# Patient Record
Sex: Male | Born: 1985 | Race: White | Hispanic: No | Marital: Single | Smoking: Never smoker
Health system: Southern US, Community
[De-identification: ages and names within clinical notes are randomized; demographics above are authoritative.]

## PROBLEM LIST (undated history)

## (undated) DIAGNOSIS — K3580 Unspecified acute appendicitis: Secondary | ICD-10-CM

## (undated) DIAGNOSIS — E559 Vitamin D deficiency, unspecified: Secondary | ICD-10-CM

## (undated) DIAGNOSIS — E538 Deficiency of other specified B group vitamins: Secondary | ICD-10-CM

## (undated) DIAGNOSIS — Z7189 Other specified counseling: Secondary | ICD-10-CM

## (undated) HISTORY — DX: Other specified counseling: Z71.89

## (undated) HISTORY — DX: Vitamin D deficiency, unspecified: E55.9

## (undated) HISTORY — DX: Unspecified acute appendicitis: K35.80

## (undated) HISTORY — DX: Deficiency of other specified B group vitamins: E53.8

---

## 2013-10-08 ENCOUNTER — Ambulatory Visit: Payer: Self-pay | Admitting: Internal Medicine

## 2016-02-19 DIAGNOSIS — Z7185 Encounter for immunization safety counseling: Secondary | ICD-10-CM

## 2016-02-19 DIAGNOSIS — Z7189 Other specified counseling: Secondary | ICD-10-CM | POA: Insufficient documentation

## 2016-02-19 HISTORY — DX: Encounter for immunization safety counseling: Z71.85

## 2016-02-20 DIAGNOSIS — E559 Vitamin D deficiency, unspecified: Secondary | ICD-10-CM | POA: Insufficient documentation

## 2016-02-20 DIAGNOSIS — E538 Deficiency of other specified B group vitamins: Secondary | ICD-10-CM

## 2016-02-20 HISTORY — DX: Vitamin D deficiency, unspecified: E55.9

## 2016-02-20 HISTORY — DX: Deficiency of other specified B group vitamins: E53.8

## 2016-07-15 ENCOUNTER — Observation Stay
Admission: AD | Admit: 2016-07-15 | Discharge: 2016-07-16 | Disposition: A | Discharge status: Home / Self Care | Payer: No Typology Code available for payment source | Source: Ambulatory Visit | Attending: Surgery | Admitting: Surgery

## 2016-07-15 ENCOUNTER — Observation Stay: Payer: No Typology Code available for payment source | Admitting: Anesthesiology

## 2016-07-15 ENCOUNTER — Other Ambulatory Visit: Payer: Self-pay | Admitting: Family Medicine

## 2016-07-15 ENCOUNTER — Encounter
Admission: AD | Disposition: A | Discharge status: Home / Self Care | Payer: Self-pay | Source: Ambulatory Visit | Attending: Surgery

## 2016-07-15 ENCOUNTER — Ambulatory Visit
Admission: RE | Admit: 2016-07-15 | Discharge: 2016-07-15 | Disposition: A | Discharge status: Home / Self Care | Payer: No Typology Code available for payment source | Source: Ambulatory Visit | Attending: Family Medicine | Admitting: Family Medicine

## 2016-07-15 DIAGNOSIS — R1031 Right lower quadrant pain: Secondary | ICD-10-CM

## 2016-07-15 DIAGNOSIS — K3580 Unspecified acute appendicitis: Secondary | ICD-10-CM

## 2016-07-15 DIAGNOSIS — K353 Acute appendicitis with localized peritonitis: Principal | ICD-10-CM | POA: Insufficient documentation

## 2016-07-15 HISTORY — DX: Unspecified acute appendicitis: K35.80

## 2016-07-15 HISTORY — PX: LAPAROSCOPIC APPENDECTOMY: SHX408

## 2016-07-15 LAB — BASIC METABOLIC PANEL
Anion gap: 10 (ref 5–15)
BUN: 21 mg/dL — ABNORMAL HIGH (ref 6–20)
CALCIUM: 9.1 mg/dL (ref 8.9–10.3)
CO2: 27 mmol/L (ref 22–32)
Chloride: 100 mmol/L — ABNORMAL LOW (ref 101–111)
Creatinine, Ser: 0.88 mg/dL (ref 0.61–1.24)
GFR calc non Af Amer: >60 mL/min (ref >60)
GLUCOSE: 81 mg/dL (ref 65–99)
Potassium: 3.8 mmol/L (ref 3.5–5.1)
Sodium: 137 mmol/L (ref 135–145)

## 2016-07-15 LAB — CBC
HEMATOCRIT: 42.6 % (ref 40.0–52.0)
Hemoglobin: 14.5 g/dL (ref 13.0–18.0)
MCH: 27.7 pg (ref 26.0–34.0)
MCHC: 34 g/dL (ref 32.0–36.0)
MCV: 81.3 fL (ref 80.0–100.0)
Platelets: 190 10*3/uL (ref 150–440)
RBC: 5.23 MIL/uL (ref 4.40–5.90)
RDW: 13.8 % (ref 11.5–14.5)
WBC: 9.2 10*3/uL (ref 3.8–10.6)

## 2016-07-15 SURGERY — APPENDECTOMY, LAPAROSCOPIC
Anesthesia: General | Site: Abdomen | Wound class: Clean Contaminated

## 2016-07-15 MED ORDER — CEFOXITIN SODIUM-DEXTROSE 2-2.2 GM-% IV SOLR (PREMIX)
2.0000 g | Freq: Once | INTRAVENOUS | Status: AC
Start: 2016-07-15 — End: 2016-07-15
  Administered 2016-07-15: 2 g via INTRAVENOUS
  Filled 2016-07-15 (×2): qty 50

## 2016-07-15 MED ORDER — SUCCINYLCHOLINE CHLORIDE 20 MG/ML IJ SOLN
INTRAMUSCULAR | Status: DC | PRN
  Administered 2016-07-15: 120 mg via INTRAVENOUS

## 2016-07-15 MED ORDER — BUPIVACAINE HCL (PF) 0.5 % IJ SOLN
INTRAMUSCULAR | Status: AC
  Filled 2016-07-15: qty 30

## 2016-07-15 MED ORDER — SEVOFLURANE IN SOLN
RESPIRATORY_TRACT | Status: AC
  Filled 2016-07-15: qty 250

## 2016-07-15 MED ORDER — MIDAZOLAM HCL 2 MG/2ML IJ SOLN
INTRAMUSCULAR | Status: DC | PRN
  Administered 2016-07-15: 2 mg via INTRAVENOUS

## 2016-07-15 MED ORDER — FENTANYL CITRATE (PF) 100 MCG/2ML IJ SOLN
INTRAMUSCULAR | Status: DC | PRN
  Administered 2016-07-15: 150 ug via INTRAVENOUS
  Administered 2016-07-15 – 2016-07-16 (×2): 50 ug via INTRAVENOUS

## 2016-07-15 MED ORDER — ACETAMINOPHEN 10 MG/ML IV SOLN
INTRAVENOUS | Status: AC
  Filled 2016-07-15: qty 100

## 2016-07-15 MED ORDER — LIDOCAINE HCL (PF) 2 % IJ SOLN
INTRAMUSCULAR | Status: AC
  Filled 2016-07-15: qty 2

## 2016-07-15 MED ORDER — MORPHINE SULFATE (PF) 2 MG/ML IV SOLN: 2.0000 mg | INTRAVENOUS | Status: DC | PRN

## 2016-07-15 MED ORDER — IOPAMIDOL (ISOVUE-300) INJECTION 61%
100.0000 mL | Freq: Once | INTRAVENOUS | Status: AC | PRN
  Administered 2016-07-15: 100 mL via INTRAVENOUS

## 2016-07-15 MED ORDER — LACTATED RINGERS IV SOLN
INTRAVENOUS | Status: DC | PRN
  Administered 2016-07-15 – 2016-07-16 (×2): via INTRAVENOUS

## 2016-07-15 MED ORDER — PROPOFOL 10 MG/ML IV BOLUS
INTRAVENOUS | Status: AC
  Filled 2016-07-15: qty 20

## 2016-07-15 MED ORDER — LIDOCAINE HCL (CARDIAC) 20 MG/ML IV SOLN
INTRAVENOUS | Status: DC | PRN
  Administered 2016-07-15: 100 mg via INTRAVENOUS

## 2016-07-15 MED ORDER — SUCCINYLCHOLINE CHLORIDE 20 MG/ML IJ SOLN
INTRAMUSCULAR | Status: AC
  Filled 2016-07-15: qty 1

## 2016-07-15 MED ORDER — SUGAMMADEX SODIUM 200 MG/2ML IV SOLN
INTRAVENOUS | Status: AC
  Filled 2016-07-15: qty 2

## 2016-07-15 MED ORDER — DEXAMETHASONE SODIUM PHOSPHATE 10 MG/ML IJ SOLN
INTRAMUSCULAR | Status: DC | PRN
  Administered 2016-07-15: 10 mg via INTRAVENOUS

## 2016-07-15 MED ORDER — PROPOFOL 10 MG/ML IV BOLUS
INTRAVENOUS | Status: DC | PRN
  Administered 2016-07-15: 200 mg via INTRAVENOUS

## 2016-07-15 MED ORDER — DEXTROSE 5 % IV SOLN
2.0000 g | Freq: Once | INTRAVENOUS | Status: DC
  Filled 2016-07-15: qty 2

## 2016-07-15 MED ORDER — DEXAMETHASONE SODIUM PHOSPHATE 10 MG/ML IJ SOLN
INTRAMUSCULAR | Status: AC
  Filled 2016-07-15: qty 1

## 2016-07-15 MED ORDER — LACTATED RINGERS IV SOLN
INTRAVENOUS | Status: DC
  Administered 2016-07-15: 19:00:00 via INTRAVENOUS

## 2016-07-15 MED ORDER — LIDOCAINE HCL (PF) 1 % IJ SOLN
INTRAMUSCULAR | Status: AC
  Filled 2016-07-15: qty 30

## 2016-07-15 MED ORDER — ONDANSETRON HCL 4 MG PO TABS: 4.0000 mg | ORAL_TABLET | Freq: Four times a day (QID) | ORAL | Status: DC | PRN

## 2016-07-15 MED ORDER — ONDANSETRON HCL 4 MG/2ML IJ SOLN
INTRAMUSCULAR | Status: AC
  Filled 2016-07-15: qty 2

## 2016-07-15 MED ORDER — HEPARIN SODIUM (PORCINE) 5000 UNIT/ML IJ SOLN
5000.0000 [IU] | Freq: Three times a day (TID) | INTRAMUSCULAR | Status: DC
  Administered 2016-07-16: 5000 [IU] via SUBCUTANEOUS
  Filled 2016-07-15: qty 1

## 2016-07-15 MED ORDER — ONDANSETRON HCL 4 MG/2ML IJ SOLN
4.0000 mg | Freq: Four times a day (QID) | INTRAMUSCULAR | Status: DC | PRN
  Administered 2016-07-15: 4 mg via INTRAVENOUS

## 2016-07-15 MED ORDER — ROCURONIUM BROMIDE 100 MG/10ML IV SOLN
INTRAVENOUS | Status: DC | PRN
  Administered 2016-07-15: 10 mg via INTRAVENOUS
  Administered 2016-07-15: 40 mg via INTRAVENOUS
  Administered 2016-07-16: 10 mg via INTRAVENOUS

## 2016-07-15 MED ORDER — FENTANYL CITRATE (PF) 250 MCG/5ML IJ SOLN
INTRAMUSCULAR | Status: AC
  Filled 2016-07-15: qty 5

## 2016-07-15 MED ORDER — MIDAZOLAM HCL 2 MG/2ML IJ SOLN
INTRAMUSCULAR | Status: AC
  Filled 2016-07-15: qty 2

## 2016-07-15 SURGICAL SUPPLY — 39 items
CANISTER SUCT 3000ML PPV (MISCELLANEOUS) ×3 IMPLANT
CHLORAPREP W/TINT 26ML (MISCELLANEOUS) ×3 IMPLANT
CUTTER FLEX LINEAR 45M (STAPLE) ×3 IMPLANT
DECANTER SPIKE VIAL GLASS SM (MISCELLANEOUS) IMPLANT
DEFOGGER SCOPE WARMER CLEARIFY (MISCELLANEOUS) ×3 IMPLANT
DERMABOND ADVANCED (GAUZE/BANDAGES/DRESSINGS) ×2
DERMABOND ADVANCED .7 DNX12 (GAUZE/BANDAGES/DRESSINGS) ×1 IMPLANT
DEVICE TROCAR PUNCTURE CLOSURE (ENDOMECHANICALS) ×3 IMPLANT
ELECT REM PT RETURN 9FT ADLT (ELECTROSURGICAL) ×3
ELECTRODE REM PT RTRN 9FT ADLT (ELECTROSURGICAL) ×1 IMPLANT
ENDOPOUCH RETRIEVER 10 (MISCELLANEOUS) ×3 IMPLANT
FILTER LAP SMOKE EVAC STRL (MISCELLANEOUS) ×3 IMPLANT
GLOVE BIO SURGEON STRL SZ7 (GLOVE) ×3 IMPLANT
GLOVE BIOGEL PI IND STRL 7.0 (GLOVE) ×3 IMPLANT
GLOVE BIOGEL PI IND STRL 7.5 (GLOVE) ×1 IMPLANT
GLOVE BIOGEL PI INDICATOR 7.0 (GLOVE) ×6
GLOVE BIOGEL PI INDICATOR 7.5 (GLOVE) ×2
GOWN STRL REUS W/ TWL XL LVL3 (GOWN DISPOSABLE) ×1 IMPLANT
GOWN STRL REUS W/TWL LRG LVL3 (GOWN DISPOSABLE) ×3 IMPLANT
GOWN STRL REUS W/TWL XL LVL3 (GOWN DISPOSABLE) ×2
IRRIGATION STRYKERFLOW (MISCELLANEOUS) ×1 IMPLANT
IRRIGATOR STRYKERFLOW (MISCELLANEOUS) ×3
KIT RM TURNOVER STRD PROC AR (KITS) ×3 IMPLANT
NEEDLE INSUFFLATION 14GA 120MM (NEEDLE) ×3 IMPLANT
NS IRRIG 1000ML POUR BTL (IV SOLUTION) IMPLANT
PACK LAP CHOLECYSTECTOMY (MISCELLANEOUS) ×3 IMPLANT
RELOAD 45 VASCULAR/THIN (ENDOMECHANICALS) IMPLANT
RELOAD STAPLE TA45 3.5 REG BLU (ENDOMECHANICALS) IMPLANT
SHEARS HARMONIC ACE PLUS 36CM (ENDOMECHANICALS) ×3 IMPLANT
SLEEVE ENDOPATH XCEL 5M (ENDOMECHANICALS) ×3 IMPLANT
SOL .9 NS 3000ML IRR  AL (IV SOLUTION)
SOL .9 NS 3000ML IRR UROMATIC (IV SOLUTION) IMPLANT
SUT MNCRL AB 4-0 PS2 18 (SUTURE) ×3 IMPLANT
SUT VICRYL 0 UR6 27IN ABS (SUTURE) ×3 IMPLANT
SUT VICRYL AB 3-0 FS1 BRD 27IN (SUTURE) ×3 IMPLANT
TRAY FOLEY W/METER SILVER 16FR (SET/KITS/TRAYS/PACK) ×3 IMPLANT
TROCAR XCEL 12X100 BLDLESS (ENDOMECHANICALS) ×3 IMPLANT
TROCAR XCEL NON-BLD 5MMX100MML (ENDOMECHANICALS) ×3 IMPLANT
TUBING INSUFFLATION (TUBING) ×3 IMPLANT

## 2016-07-15 NOTE — Anesthesia Preprocedure Evaluation (Addendum)
Anesthesia Evaluation  Patient identified by MRN, date of birth, ID band Patient awake    Reviewed: Allergy & Precautions, NPO status , Patient's Chart, lab work & pertinent test results  History of Anesthesia Complications Negative for: history of anesthetic complications  Airway Mallampati: III  TM Distance: >3 FB Neck ROM: Full    Dental no notable dental hx.    Pulmonary neg pulmonary ROS, neg sleep apnea, neg COPD,    breath sounds clear to auscultation- rhonchi (-) wheezing      Cardiovascular Exercise Tolerance: Good (-) hypertension(-) CAD and (-) Past MI  Rhythm:Regular Rate:Normal - Systolic murmurs and - Diastolic murmurs    Neuro/Psych negative neurological ROS  negative psych ROS   GI/Hepatic negative GI ROS, Neg liver ROS,   Endo/Other  negative endocrine ROSneg diabetes  Renal/GU negative Renal ROS     Musculoskeletal negative musculoskeletal ROS (+)   Abdominal (+) - obese,   Peds  Hematology negative hematology ROS (+)   Anesthesia Other Findings   Reproductive/Obstetrics                             Anesthesia Physical Anesthesia Plan  ASA: I  Anesthesia Plan: General   Post-op Pain Management:    Induction: Intravenous, Rapid sequence and Cricoid pressure planned  Airway Management Planned: Oral ETT  Additional Equipment:   Intra-op Plan:   Post-operative Plan: Extubation in OR  Informed Consent: I have reviewed the patients History and Physical, chart, labs and discussed the procedure including the risks, benefits and alternatives for the proposed anesthesia with the patient or authorized representative who has indicated his/her understanding and acceptance.   Dental advisory given  Plan Discussed with: CRNA and Anesthesiologist  Anesthesia Plan Comments:         Anesthesia Quick Evaluation

## 2016-07-15 NOTE — Anesthesia Procedure Notes (Signed)
Procedure Name: Intubation Date/Time: 07/15/2016 11:28 PM Performed by: Nelda Marseille Pre-anesthesia Checklist: Patient identified, Patient being monitored, Timeout performed, Emergency Drugs available and Suction available Patient Re-evaluated:Patient Re-evaluated prior to inductionOxygen Delivery Method: Circle system utilized Preoxygenation: Pre-oxygenation with 100% oxygen Intubation Type: IV induction Ventilation: Mask ventilation without difficulty Laryngoscope Size: Mac, 3 and McGraph Grade View: Grade III Tube type: Oral Tube size: 7.5 mm Number of attempts: 1 Airway Equipment and Method: Stylet and Video-laryngoscopy Placement Confirmation: ETT inserted through vocal cords under direct vision,  positive ETCO2 and breath sounds checked- equal and bilateral Secured at: 21 cm Tube secured with: Tape Dental Injury: Teeth and Oropharynx as per pre-operative assessment

## 2016-07-15 NOTE — Interval H&P Note (Signed)
History and Physical Interval Note:  07/15/2016 8:57 PM  Ray Oneill  has presented today for surgery, with the diagnosis of acute appendicitis  The various methods of treatment have been discussed with the patient and family. After consideration of risks, benefits and other options for treatment, the patient has consented to  Procedure(s): APPENDECTOMY LAPAROSCOPIC (N/A) as a surgical intervention .  The patient's history has been reviewed, patient examined, no change in status, stable for surgery.  I have reviewed the patient's chart and labs.  Questions were answered to the patient's satisfaction.    -- Scherrie GerlachJason E. Earlene Plateravis, MD, RPVI [place]: [facility] - Partnering for exceptional care. Office: [phone]

## 2016-07-15 NOTE — H&P (Signed)
Ray Oneill is an 31 y.o. male.    Chief Complaint: Right lower quadrant pain  HPI: This a patient with acute onset of right lower quadrant pain that started last night he states he was perfectly normal the day before. Last night he started having abdominal pain and it is constantly points to the right lower quadrant and right flank. He had some nausea but no emesis no fevers or chills he has not eaten since last night he's never had an episode like this before. 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  No past surgical history on file.  No family history on file. Social History:  has no tobacco, alcohol, and drug history on file.  Allergies: Allergies not on file  No prescriptions prior to admission.     Review of Systems  Constitutional: Negative.   HENT: Negative.   Eyes: Negative.   Respiratory: Negative.   Cardiovascular: Negative.   Gastrointestinal: Positive for abdominal pain and nausea. Negative for blood in stool, constipation, diarrhea, heartburn and vomiting.  Genitourinary: Negative.   Musculoskeletal: Negative.   Skin: Negative.   Neurological: Negative.   Endo/Heme/Allergies: Negative.   Psychiatric/Behavioral: Negative.      Physical Exam:  BP 120/71 (BP Location: Right Arm)   Pulse 90   Temp 98.3 F (36.8 C) (Oral)   Resp 18   SpO2 97%   Physical Exam  Constitutional: He is oriented to person, place, and time and well-developed, well-nourished, and in no distress. No distress.  HENT:  Head: Normocephalic and atraumatic.  Eyes: Pupils are equal, round, and reactive to light. Right eye exhibits no discharge. Left eye exhibits no discharge. No scleral icterus.  Neck: Normal range of motion.  Cardiovascular: Normal rate, regular  rhythm and normal heart sounds.   Pulmonary/Chest: Effort normal and breath sounds normal. No respiratory distress. He has no wheezes. He has no rales.  Abdominal: Soft. He exhibits no distension. There is tenderness. There is guarding. There is no rebound.  Maximal tenderness at McBurney's point with guarding  Musculoskeletal: Normal range of motion. He exhibits no edema.  Lymphadenopathy:    He has no cervical adenopathy.  Neurological: He is alert and oriented to person, place, and time.  Skin: Skin is warm and dry. No rash noted. He is not diaphoretic. No erythema.  Psychiatric: Mood and affect normal.  Vitals reviewed.       No results found for this or any previous visit (from the past 48 hour(s)). Ct Abdomen Pelvis W Contrast  Result Date: 07/15/2016 CLINICAL DATA:  Acute right lower quadrant abdominal pain. EXAM: CT ABDOMEN AND PELVIS WITH CONTRAST TECHNIQUE: Multidetector CT imaging of the abdomen and pelvis was performed using the standard protocol following bolus administration of intravenous contrast. CONTRAST:  100mL ISOVUE-300 IOPAMIDOL (ISOVUE-300) INJECTION 61% COMPARISON:  None. FINDINGS: Lower chest: No acute abnormality. Hepatobiliary: No focal liver abnormality is seen. No gallstones, gallbladder wall thickening, or biliary dilatation. Pancreas: Unremarkable. No pancreatic ductal dilatation or surrounding inflammatory changes. Spleen: Normal in size without focal abnormality. Adrenals/Urinary Tract: Adrenal glands are unremarkable. Kidneys are normal, without renal calculi, focal lesion, or hydronephrosis. Bladder is unremarkable. Stomach/Bowel: There is no evidence of bowel obstruction. The appendix is visualized and there is noted contrast and a small amount of gas  in its distal portion. However, its midportion is thickened at 11 mm with possible minimal inflammatory changes, and these findings are equivocal for appendicitis. Vascular/Lymphatic: No significant vascular  findings are present. No enlarged abdominal or pelvic lymph nodes. Reproductive: Prostate is unremarkable. Other: No abdominal wall hernia or abnormality. No abdominopelvic ascites. Musculoskeletal: No acute or significant osseous findings. IMPRESSION: There is noted mild thickening of the midportion of the appendix with possible surrounding inflammatory changes, and these findings are equivocal for appendicitis. Correlation with white blood count and physical exam is recommended to assess for the possibility of appendicitis. Electronically Signed   By: Lupita Raider, M.D.   On: 07/15/2016 12:53     Assessment/Plan  CT scan is personally reviewed. No labs are available yet but have been ordered. This a patient with likely appendicitis both on history physical and CT findings. I have recommended laparoscopic appendectomy the rationale for this was discussed with he and his family the options of observation reviewed the risk of bleeding infection recurrence negative laparoscopy and conversion to an open procedure were performed. Also discussed with them that Dr. Satira Mccallum would be performing the surgery later this evening he is made nothing by mouth he understood and agreed to proceed  Lattie Haw, MD, FACS

## 2016-07-16 ENCOUNTER — Encounter: Payer: Self-pay | Admitting: Surgery

## 2016-07-16 DIAGNOSIS — K353 Acute appendicitis with localized peritonitis: Secondary | ICD-10-CM | POA: Diagnosis not present

## 2016-07-16 MED ORDER — PROMETHAZINE HCL 25 MG/ML IJ SOLN: 6.2500 mg | INTRAMUSCULAR | Status: DC | PRN

## 2016-07-16 MED ORDER — SUGAMMADEX SODIUM 200 MG/2ML IV SOLN
INTRAVENOUS | Status: DC | PRN
  Administered 2016-07-16: 151 mg via INTRAVENOUS

## 2016-07-16 MED ORDER — OXYCODONE HCL 5 MG PO TABS: 5.0000 mg | ORAL_TABLET | Freq: Once | ORAL | Status: DC | PRN

## 2016-07-16 MED ORDER — METRONIDAZOLE IN NACL 5-0.79 MG/ML-% IV SOLN
500.0000 mg | Freq: Three times a day (TID) | INTRAVENOUS | Status: DC
  Filled 2016-07-16: qty 100

## 2016-07-16 MED ORDER — ACETAMINOPHEN 10 MG/ML IV SOLN
INTRAVENOUS | Status: DC | PRN
  Administered 2016-07-16: 1000 mg via INTRAVENOUS

## 2016-07-16 MED ORDER — FENTANYL CITRATE (PF) 100 MCG/2ML IJ SOLN: 25.0000 ug | INTRAMUSCULAR | Status: DC | PRN

## 2016-07-16 MED ORDER — MEPERIDINE HCL 50 MG/ML IJ SOLN: 6.2500 mg | INTRAMUSCULAR | Status: DC | PRN

## 2016-07-16 MED ORDER — OXYCODONE-ACETAMINOPHEN 5-325 MG PO TABS
1.0000 | ORAL_TABLET | ORAL | Status: DC | PRN
  Administered 2016-07-16 (×2): 1 via ORAL
  Filled 2016-07-16 (×2): qty 2

## 2016-07-16 MED ORDER — LIDOCAINE HCL 1 % IJ SOLN
INTRAMUSCULAR | Status: DC | PRN
  Administered 2016-07-16: 12 mL via INTRADERMAL

## 2016-07-16 MED ORDER — DEXTROSE 5 % IV SOLN
2.0000 g | INTRAVENOUS | Status: AC
  Administered 2016-07-16: 2 g via INTRAVENOUS
  Filled 2016-07-16: qty 2

## 2016-07-16 MED ORDER — KCL IN DEXTROSE-NACL 20-5-0.45 MEQ/L-%-% IV SOLN
INTRAVENOUS | Status: DC
  Administered 2016-07-16: 03:00:00 via INTRAVENOUS
  Filled 2016-07-16 (×3): qty 1000

## 2016-07-16 MED ORDER — OXYCODONE HCL 5 MG/5ML PO SOLN: 5.0000 mg | Freq: Once | ORAL | Status: DC | PRN

## 2016-07-16 MED ORDER — METRONIDAZOLE IN NACL 5-0.79 MG/ML-% IV SOLN
500.0000 mg | Freq: Three times a day (TID) | INTRAVENOUS | Status: AC
  Administered 2016-07-16: 500 mg via INTRAVENOUS
  Filled 2016-07-16: qty 100

## 2016-07-16 MED ORDER — OXYCODONE-ACETAMINOPHEN 5-325 MG PO TABS: 1.0000 | ORAL_TABLET | ORAL | 0 refills | Status: DC | PRN

## 2016-07-16 MED ORDER — DEXTROSE 5 % IV SOLN
2.0000 g | INTRAVENOUS | Status: DC
  Filled 2016-07-16: qty 2

## 2016-07-16 MED ORDER — IBUPROFEN 400 MG PO TABS
600.0000 mg | ORAL_TABLET | Freq: Four times a day (QID) | ORAL | Status: DC | PRN
  Filled 2016-07-16: qty 2

## 2016-07-16 NOTE — Progress Notes (Signed)
Pt to be discharged per MD order. IV removed. instructions reviewed with pt and family, all questions answered. Pain meds given and pt will wait one hour before leaving. Scripts given to pt. Will discharge in wheelchair.

## 2016-07-16 NOTE — Op Note (Signed)
SURGICAL OPERATIVE REPORT  DATE OF PROCEDURE: 07/16/2016  ATTENDING Surgeon(s): Ancil Linsey, MD  ANESTHESIA: GETA (General)  PRE-OPERATIVE DIAGNOSIS: Acute non-perforated appendicitis with localized peritonitis (K35.3)  POST-OPERATIVE DIAGNOSIS: Acute non-perforated appendicitis with localized peritonitis (K35.3)  PROCEDURE(S):  1.) Laparoscopic appendectomy (cpt: 44970)  INTRAOPERATIVE FINDINGS: Moderately inflamed non-perforated appendix  INTRAVENOUS FLUIDS: 1000 mL crystalloid   ESTIMATED BLOOD LOSS: Minimal (<20 mL)  URINE OUTPUT: 150 mL   SPECIMENS: Appendix  IMPLANTS: None  DRAINS: None  COMPLICATIONS: None apparent  CONDITION AT END OF PROCEDURE: Hemodynamically stable and extubated  DISPOSITION OF PATIENT: PACU  INDICATIONS FOR PROCEDURE:  Patient is a 31 y.o. otherwise reportedly healthy male who presented with acute onset of Right lower quadrant abdominal pain. Patient reports his pain has been well-controlled since his initial presentation. All risks, benefits, and alternatives to appendectomy were discussed with patient, all of patient's questions were answered to his expressed satisfaction, patient expressed his wish to proceed, and informed consent was obtained and documented.  DETAILS OF PROCEDURE: Patient was brought to the operating suite and appropriately identified. General anesthesia was administered along with confirmation of appropriate pre-operative antibiotics, and endotracheal intubation was performed by anesthetist, along with NG/OG tube for gastric decompression. In supine position, operative site was prepped and draped in usual sterile fashion, and following a brief time out, initial 5 mm incision was made in a natural skin crease just below the umbilicus. Fascia was then elevated, and a Verress needle was inserted and its proper position confirmed using saline meniscus test prior to abdominal insufflation. At time of Verress needle  insertion and subsequent port insertions, patient's peritoneum was found to be very elastic.  Upon insufflation of the abdominal cavity with carbon dioxide to a well-tolerated pressure of 12-15 mmHg, a 5 mm peri-umbilical port followed by laparoscope were inserted and used to inspect the abdominal cavity and its contents with no injuries from insertion of the first trochar noted. Two additional trocars were inserted, a 12 mm port at the Left lower quadrant position and another 5 mm port at the suprapubic position. The table was then placed in Trendelenburg position with the Right side up, and blunt graspers were gently used to mobilize the bowel overlying a clearly inflamed appendix. The appendix was gently retracted by near its tip, and the base of the appendix and mesoappendix were identified in relation to the cecum. The mesoappendix was dissected from the visceral appendix and hemostasis achieved using a harmonic scalpel. Upon freeing the visceral appendix from the mesoappendix, an endostapler loaded with a 45 mm standard (blue) tissue load was advanced across the base of the visceral appendix, which was compressed for several seconds, and the stapler was deployed and removed from the abdominal cavity. Hemostasis was confirmed, and the specimen was extracted from the abdominal cavity in a laparoscopic specimen bag.  The intraperitoneal cavity was inspected with no additional findings. Endoclose laparoscopic fascial closure device was then used to re-approximate fascia at the 12 mm Left lower quadrant port site. All ports were then removed under direct visualization, and the abdominal cavity was desuflated. All port sites were irrigated/cleaned, additional local anesthetic was injected at each incision, 3-0 Vicryl was used to re-approximate dermis at 12 mm port site(s), and subcuticular 4-0 Monocryl suture was used to re-approximate skin. Skin was then cleaned, dried, and sterile skin glue was applied.  Patient was then safely able to be awakened, extubated, and transferred to PACU for post-operative monitoring and care.   I was  present for all aspects of procedure, and there were no intra-operative complications apparent.

## 2016-07-16 NOTE — Anesthesia Post-op Follow-up Note (Cosign Needed)
Anesthesia QCDR form completed.        

## 2016-07-16 NOTE — Discharge Instructions (Signed)
In addition to included general post-operative instructions for Laparoscopic Appendectomy,  Diet: Resume home heart healthy regular diet.   Activity: No heavy lifting >20 pounds (children, pets, laundry, garbage) or strenuous activity until follow-up, but light activity and walking are encouraged. Do not drive or drink alcohol if taking narcotic pain medications.  Wound care: 2 days after surgery (Thursday, 5/17), may shower/get incision wet with soapy water and pat dry (do not rub incisions), but no baths or submerging incision underwater until follow-up.   Medications: Resume all home medications. For mild to moderate pain: acetaminophen (Tylenol) or ibuprofen (if no kidney disease). Combining Tylenol with alcohol can substantially increase your risk of causing liver disease. Narcotic pain medications, if prescribed, can be used for severe pain, though may cause nausea, constipation, and drowsiness. Do not combine Tylenol and Percocet within a 6 hour period as Percocet contains Tylenol. If you do not need the narcotic pain medication, you do not need to fill the prescription.  Call office [phone]) at any time if any questions, worsening pain, fevers/chills, bleeding, drainage from incision site, or other concerns.

## 2016-07-16 NOTE — Anesthesia Postprocedure Evaluation (Signed)
Anesthesia Post Note  Patient: Parthenia AmesBenjamin Ruffin Terlizzi  Procedure(s) Performed: Procedure(s) (LRB): APPENDECTOMY LAPAROSCOPIC (N/A)  Patient location during evaluation: PACU Anesthesia Type: General Level of consciousness: awake and alert and oriented Pain management: pain level controlled Vital Signs Assessment: post-procedure vital signs reviewed and stable Respiratory status: spontaneous breathing, nonlabored ventilation and respiratory function stable Cardiovascular status: blood pressure returned to baseline and stable Postop Assessment: no signs of nausea or vomiting Anesthetic complications: no     Last Vitals:  Vitals:   07/16/16 0204 07/16/16 0332  BP: 122/68 (!) 110/46  Pulse: 99 71  Resp: 20 16  Temp: 36.9 C 36.4 C    Last Pain:  Vitals:   07/16/16 0332  TempSrc: Oral  PainSc:                  Cambell Stanek

## 2016-07-16 NOTE — Progress Notes (Signed)
SURGICAL POST-OPERATIVE NOTE   Patient seen and examined, reports RLQ abdominal pain resolved and LLQ abdomen "sore", though pain controlled, tolerated drinking water, denies N/V, fever/chills, CP, or SOB.  Review of Systems:  Constitutional: denies fever, chills  HEENT: denies cough or congestion  Respiratory: denies any shortness of breath  Cardiovascular: denies chest pain or palpitations  Gastrointestinal: abdominal pain and N/V as per interval history  Musculoskeletal: denies pain, decreased motor or sensation  Neurological: denies HA or vision/hearing changes   Vital signs  Vitals:   07/16/16 0054 07/16/16 0110 07/16/16 0133 07/16/16 0204  BP:  118/71 124/69 122/68  Pulse: (!) 103 (!) 102 (!) 103 99  Resp: 14 15 17 20   Temp:  98.3 F (36.8 C) 98.6 F (37 C) 98.4 F (36.9 C)  TempSrc:   Oral Oral  SpO2: 98% 97% 95% 94%  Weight:      Height:           Height: 5\' 11"  (180.3 cm)  Weight: 166 lb 8 oz (75.5 kg) BMI (Calculated): 23.3   Intake/Output:  Total I/O In: 1411 [I.V.:1411] Out: 110 [Urine:100; Blood:10]   Physical Exam:  General: Alert, cooperative and no distress  Neuro: CNII - XII grossly intact and symmetric, no motor or sensory deficits  HENT: Normocephalic, without obvious abnormality  Eyes: PERRL, EOM's grossly intact and symmetric  Lungs: Breathing non-labored at rest  Cardiovascular: Regular rate and sinus rhythm  Gastrointestinal: Abdomen soft and non-tender except for LLQ peri-incisional tenderness to palpation with incisions well-approximated without erythema or drainage  Extremities: UE and LE FROM, NT, no edema or wounds   Assessment/Plan:  31 y.o. male POD #0 s/p laparoscopic appendectomy for acute non-perforated appendicitis.    - pain control as needed   - advance diet as tolerated   - ambulation in morning encouraged   - discharge planning  All of the above findings and recommendations were discussed with the patient and his family,  and all of patient's and his family's questions were answered to their expressed satisfaction.  -- Scherrie GerlachJason E. Earlene Plateravis, MD, RPVI [place]: [facility] Office: [phone]

## 2016-07-16 NOTE — Transfer of Care (Signed)
Immediate Anesthesia Transfer of Care Note  Patient: Ray Oneill  Procedure(s) Performed: Procedure(s): APPENDECTOMY LAPAROSCOPIC (N/A)  Patient Location: PACU  Anesthesia Type:General  Level of Consciousness: sedated  Airway & Oxygen Therapy: Patient Spontanous Breathing and Patient connected to face mask oxygen  Post-op Assessment: Report given to RN and Post -op Vital signs reviewed and stable  Post vital signs: Reviewed and stable  Last Vitals:  Vitals:   07/15/16 1707 07/15/16 2042  BP: 120/71 133/64  Pulse: 90 98  Resp: 18 18  Temp: 36.8 C 37 C    Last Pain:  Vitals:   07/15/16 2042  TempSrc: Oral  PainSc:       Patients Stated Pain Goal: 0 (07/15/16 1800)  Complications: No apparent anesthesia complications

## 2016-07-16 NOTE — Progress Notes (Signed)
1 Day Post-Op  Subjective: Status post laparoscopic appendectomy. Patient feels better and wants to advance diet.  Objective: Vital signs in last 24 hours: Temp:  [97.5 F (36.4 C)-98.6 F (37 C)] 97.5 F (36.4 C) (05/15 0332) Pulse Rate:  [71-103] 71 (05/15 0332) Resp:  [13-20] 16 (05/15 0332) BP: (110-133)/(46-76) 110/46 (05/15 0332) SpO2:  [94 %-98 %] 95 % (05/15 0332) Weight:  [166 lb 8 oz (75.5 kg)] 166 lb 8 oz (75.5 kg) (05/14 2031) Last BM Date: 07/14/16  Intake/Output from previous day: 05/14 0701 - 05/15 0700 In: 2578 [P.O.:120; I.V.:2458] Out: 212 [Urine:202; Blood:10] Intake/Output this shift: Total I/O In: 750 [I.V.:750] Out: -   Physical exam:  Abdomen soft nontender wounds are clean no erythema or drainage  Lab Results: CBC   Recent Labs  07/15/16 1902  WBC 9.2  HGB 14.5  HCT 42.6  PLT 190   BMET  Recent Labs  07/15/16 1902  NA 137  K 3.8  CL 100*  CO2 27  GLUCOSE 81  BUN 21*  CREATININE 0.88  CALCIUM 9.1   PT/INR No results for input(s): LABPROT, INR in the last 72 hours. ABG No results for input(s): PHART, HCO3 in the last 72 hours.  Invalid input(s): PCO2, PO2  Studies/Results: Ct Abdomen Pelvis W Contrast  Result Date: 07/15/2016 CLINICAL DATA:  Acute right lower quadrant abdominal pain. EXAM: CT ABDOMEN AND PELVIS WITH CONTRAST TECHNIQUE: Multidetector CT imaging of the abdomen and pelvis was performed using the standard protocol following bolus administration of intravenous contrast. CONTRAST:  100mL ISOVUE-300 IOPAMIDOL (ISOVUE-300) INJECTION 61% COMPARISON:  None. FINDINGS: Lower chest: No acute abnormality. Hepatobiliary: No focal liver abnormality is seen. No gallstones, gallbladder wall thickening, or biliary dilatation. Pancreas: Unremarkable. No pancreatic ductal dilatation or surrounding inflammatory changes. Spleen: Normal in size without focal abnormality. Adrenals/Urinary Tract: Adrenal glands are unremarkable. Kidneys  are normal, without renal calculi, focal lesion, or hydronephrosis. Bladder is unremarkable. Stomach/Bowel: There is no evidence of bowel obstruction. The appendix is visualized and there is noted contrast and a small amount of gas in its distal portion. However, its midportion is thickened at 11 mm with possible minimal inflammatory changes, and these findings are equivocal for appendicitis. Vascular/Lymphatic: No significant vascular findings are present. No enlarged abdominal or pelvic lymph nodes. Reproductive: Prostate is unremarkable. Other: No abdominal wall hernia or abnormality. No abdominopelvic ascites. Musculoskeletal: No acute or significant osseous findings. IMPRESSION: There is noted mild thickening of the midportion of the appendix with possible surrounding inflammatory changes, and these findings are equivocal for appendicitis. Correlation with white blood count and physical exam is recommended to assess for the possibility of appendicitis. Electronically Signed   By: Lupita RaiderJames  Green Jr, M.D.   On: 07/15/2016 12:53    Anti-infectives: Anti-infectives    Start     Dose/Rate Route Frequency Ordered Stop   07/16/16 0215  cefTRIAXone (ROCEPHIN) 2 g in dextrose 5 % 50 mL IVPB     2 g 100 mL/hr over 30 Minutes Intravenous Every 24 hours 07/16/16 0207 07/16/16 0325   07/16/16 0215  metroNIDAZOLE (FLAGYL) IVPB 500 mg     500 mg 100 mL/hr over 60 Minutes Intravenous Every 8 hours 07/16/16 0207 07/16/16 0357   07/16/16 0156  cefTRIAXone (ROCEPHIN) 2 g in dextrose 5 % 100 mL IVPB  Status:  Discontinued     2 g 200 mL/hr over 30 Minutes Intravenous Every 24 hours 07/16/16 0156 07/16/16 0207   07/16/16 0156  metroNIDAZOLE (FLAGYL)  IVPB 500 mg  Status:  Discontinued     500 mg 100 mL/hr over 60 Minutes Intravenous Every 8 hours 07/16/16 0156 07/16/16 0207   07/15/16 1845  cefOXItin (MEFOXIN) 2 g in dextrose 50 mL IVPB (premix)     2 g 100 mL/hr over 30 Minutes Intravenous  Once 07/15/16 1842  07/15/16 2329   07/15/16 1815  cefOXitin (MEFOXIN) 2 g in dextrose 5 % 50 mL IVPB  Status:  Discontinued     2 g 100 mL/hr over 30 Minutes Intravenous  Once 07/15/16 1805 07/15/16 1841      Assessment/Plan: s/p Procedure(s): APPENDECTOMY LAPAROSCOPIC   Patient is doing quite well will discharge later today in follow-up next week  Lattie Haw, MD, FACS  07/16/2016

## 2016-07-16 NOTE — Discharge Summary (Signed)
Physician Discharge Summary  Patient ID: Ray Oneill MRN: [id number] DOB/AGE: 29/11/1985 29 y.o.  Admit date: 07/15/2016 Discharge date: 07/16/2016   Discharge Diagnoses:  Active Problems:   Acute appendicitis   Procedures:Laparoscopic appendectomy  Hospital Course: This patient status post laparoscopic appendectomy for acute appendicitis. He is tolerating a diet and wishes to be discharged this morning his diet will be advanced to regular and he'll follow-up in our office in 10 days with instructions to shower and do no heavy lifting until seen in the office he'll be given oral analgesics for pain.  Consults: None  Disposition: Final discharge disposition not confirmed   Allergies as of 07/16/2016   Not on File     Medication List    TAKE these medications   oxyCODONE-acetaminophen 5-325 MG tablet Commonly known as:  PERCOCET/ROXICET Take 1-2 tablets by mouth every 4 (four) hours as needed for moderate pain.      Follow-up Information    [facility] [place] Schedule an appointment as soon as possible for a visit in 2 week(s).   Specialty:  General Surgery Contact information: [address] [id number] [phone]          Lattie Hawichard E Cooper, MD, FACS

## 2016-07-17 LAB — SURGICAL PATHOLOGY

## 2016-07-17 LAB — HIV ANTIBODY (ROUTINE TESTING W REFLEX): HIV SCREEN 4TH GENERATION: NONREACTIVE

## 2016-07-22 ENCOUNTER — Other Ambulatory Visit: Payer: Self-pay

## 2016-07-31 ENCOUNTER — Ambulatory Visit (INDEPENDENT_AMBULATORY_CARE_PROVIDER_SITE_OTHER): Payer: No Typology Code available for payment source | Admitting: General Surgery

## 2016-07-31 ENCOUNTER — Encounter: Payer: Self-pay | Admitting: General Surgery

## 2016-07-31 VITALS — BP 126/81 | HR 90 | Temp 98.2°F | Ht 71.0 in | Wt 174.0 lb

## 2016-07-31 DIAGNOSIS — Z4889 Encounter for other specified surgical aftercare: Secondary | ICD-10-CM

## 2016-07-31 NOTE — Patient Instructions (Signed)
Please call our office with any questions or concerns.  You may now submerge in a tub, hot tub, or pool since incisions are completely sealed.  Use sun block to incision area over the next year if this area will be exposed to sun. This helps decrease scarring.  You may resume your normal activities on 08/26/2016. At that time- Listen to your body when lifting, if you have pain when lifting, stop and then try again in a few days. Pain after doing exercises or activities of daily living is normal as you get back in to your normal routine.  If you develop redness, drainage, or pain at incision sites- call our office immediately and speak with a nurse.

## 2016-07-31 NOTE — Progress Notes (Signed)
Outpatient Surgical Follow Up  07/31/2016  Ray Oneill is an 31 y.o. male.   Chief Complaint  Patient presents with  . Routine Post Op    Laaroscopic Appendectomy 07/15/16 Dr. Earlene Oneill    HPI: 31 year old male returns to clinic 16 days status post laparoscopic appendectomy. He is artery return to work and his normal activities. He's been having some soreness primarily in his left lower quadrant. He reports this is improving. He is eating well and having normal bowel function for him. He denies any fevers, chills, nausea, vomiting, chest pain, shortness breath, diarrhea.  Past Medical History:  Diagnosis Date  . Acute appendicitis 07/15/2016  . B12 deficiency 02/20/2016  . Vaccine counseling 02/19/2016  . Vitamin D insufficiency 02/20/2016    Past Surgical History:  Procedure Laterality Date  . LAPAROSCOPIC APPENDECTOMY N/A 07/15/2016   Procedure: APPENDECTOMY LAPAROSCOPIC;  Surgeon: Ray Oneill, Ray Evan, MD;  Location: [facility] ORS;  Service: General;  Laterality: N/A;    History reviewed. No pertinent family history.  Social History:  reports that he has never smoked. He has never used smokeless tobacco. His alcohol and drug histories are not on file.  Allergies: Not on File  Medications reviewed.    ROS A multipoint review of systems was completed, all pertinent positives and negatives are documented within the history of present illness and remainder are negative   BP 126/81   Pulse 90   Temp 98.2 F (36.8 C) (Oral)   Ht 5\' 11"  (1.803 m)   Wt 78.9 kg (174 lb)   BMI 24.27 kg/m   Physical Exam Gen.: No acute distress Chest: Clear to sedation Heart: Regular rhythm Abdomen: Soft, nontender, nondistended. Well approximated laparoscopic incision sites with Dermabond still in place. No evidence of erythema or drainage.    No results found for this or any previous visit (from the past 48 hour(s)). No results found.  Assessment/Plan:  1. Aftercare following  surgery 31 year old male status post laparoscopic appendectomy. Pathology reviewed. Discussed with the patient standard postoperative precautions and normal healing process for returning to activities. He voiced understanding and will follow-up in clinic on an as-needed basis.     Ray Frameharles Ray Vonbargen, MD FACS General Surgeon  07/31/2016,2:05 PM

## 2018-01-01 ENCOUNTER — Other Ambulatory Visit: Payer: Self-pay | Admitting: Family Medicine

## 2018-01-01 DIAGNOSIS — R1031 Right lower quadrant pain: Secondary | ICD-10-CM

## 2018-01-05 ENCOUNTER — Ambulatory Visit
Admission: RE | Admit: 2018-01-05 | Discharge: 2018-01-05 | Disposition: A | Discharge status: Home / Self Care | Payer: BLUE CROSS/BLUE SHIELD | Source: Ambulatory Visit | Attending: Family Medicine | Admitting: Family Medicine

## 2018-01-05 DIAGNOSIS — R1031 Right lower quadrant pain: Secondary | ICD-10-CM | POA: Insufficient documentation

## 2018-01-05 MED ORDER — IOPAMIDOL (ISOVUE-300) INJECTION 61%
100.0000 mL | Freq: Once | INTRAVENOUS | Status: AC | PRN
  Administered 2018-01-05: 100 mL via INTRAVENOUS

## 2018-02-24 IMAGING — CT CT ABD-PELV W/ CM
2 of 5 series · 16 of 46 positions shown, 18 images · IV contrast (APPLIED)
Comparison: None.

CLINICAL DATA: Acute right lower quadrant abdominal pain.

EXAM:
CT ABDOMEN AND PELVIS WITH CONTRAST
TECHNIQUE: Multidetector CT imaging of the abdomen and pelvis was performed
using the standard protocol following bolus administration of
intravenous contrast.
CONTRAST:  100mL 1QZCEK-BCC IOPAMIDOL (1QZCEK-BCC) INJECTION 61%

[Series 2: routine abd/pel with · axial · 0.71mm/px · z∈[-506,-86]mm · 13 of 93 slices shown, 15 images]
[im 5/93  soft-tissue]
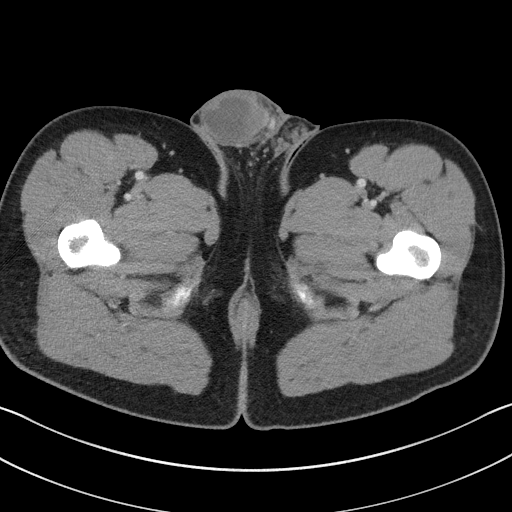
[im 5/93  bone]
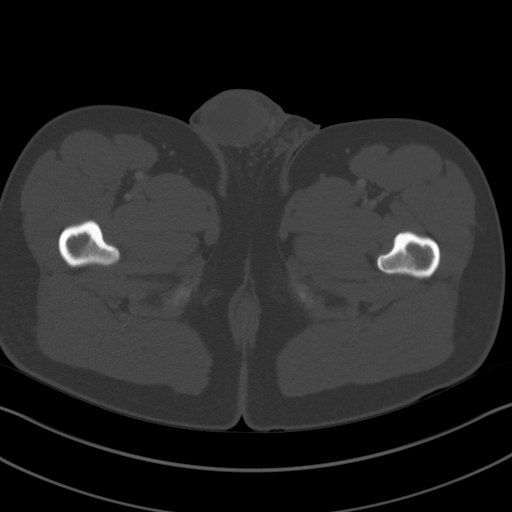
[im 13/93  soft-tissue]
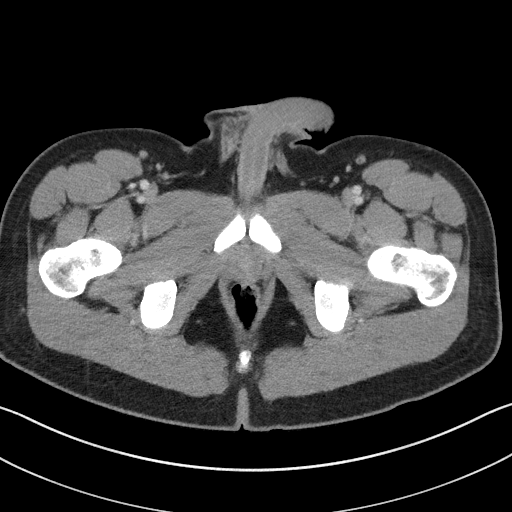
[im 21/93  soft-tissue]
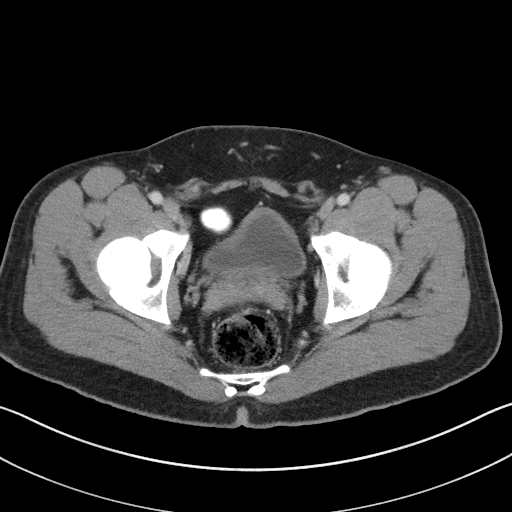
[im 25/93  soft-tissue]
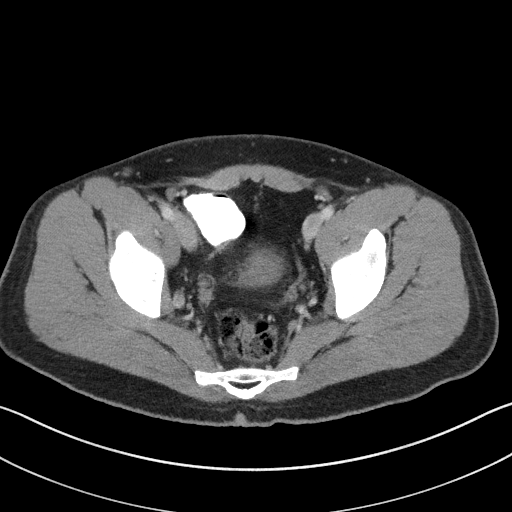
[im 33/93  soft-tissue]
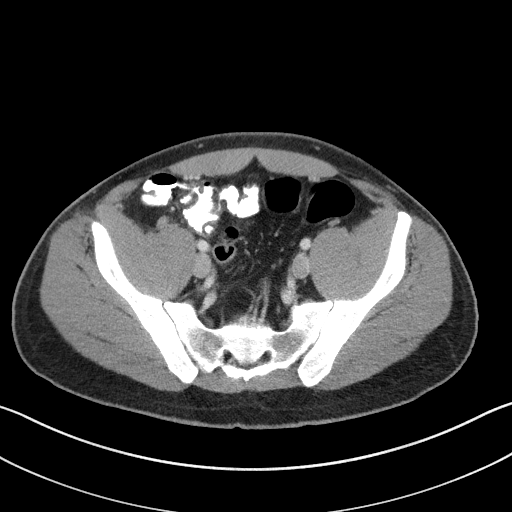
[im 41/93  soft-tissue]
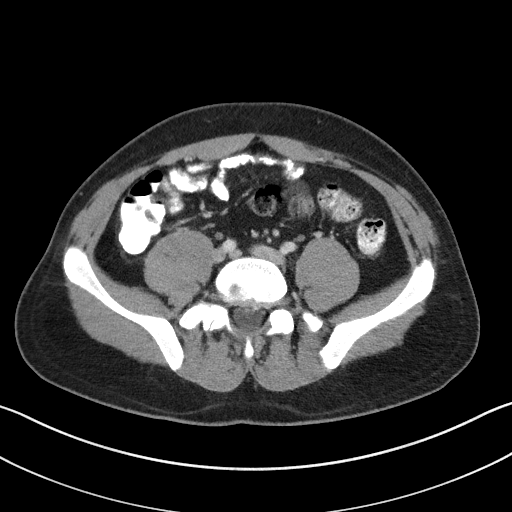
[im 49/93  soft-tissue]
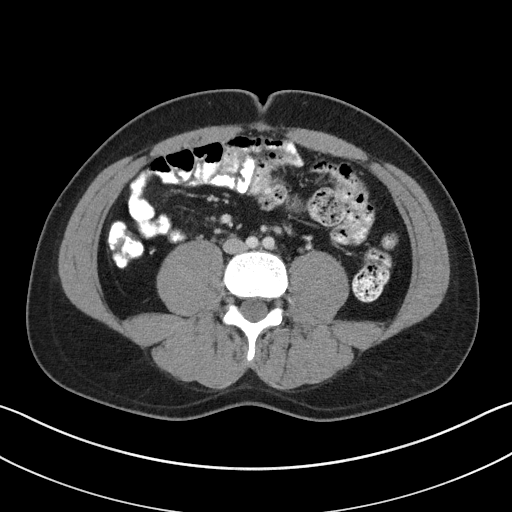
[im 53/93  soft-tissue]
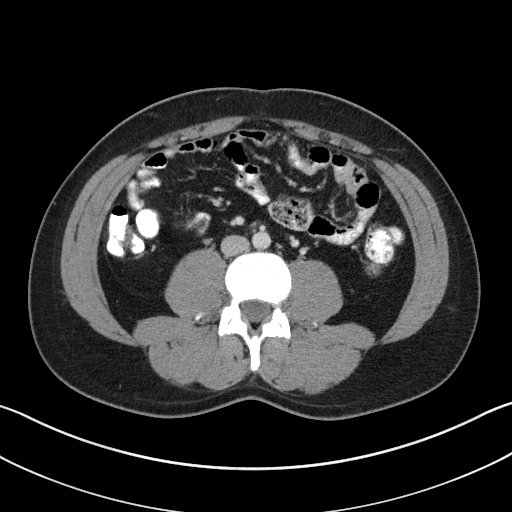
[im 61/93  soft-tissue]
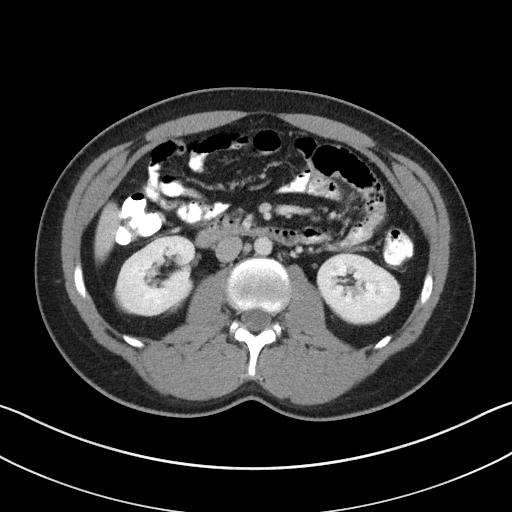
[im 61/93  bone]
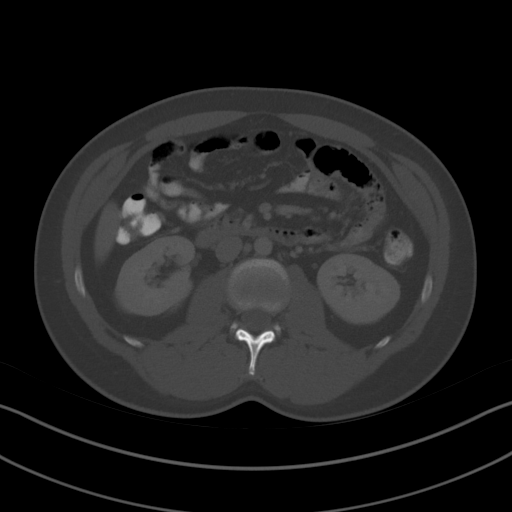
[im 69/93  soft-tissue]
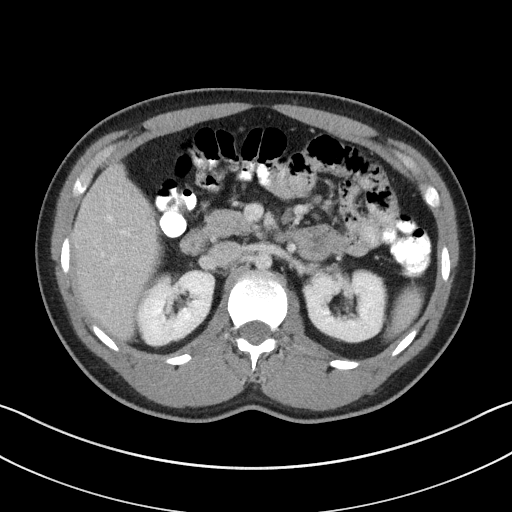
[im 73/93  soft-tissue]
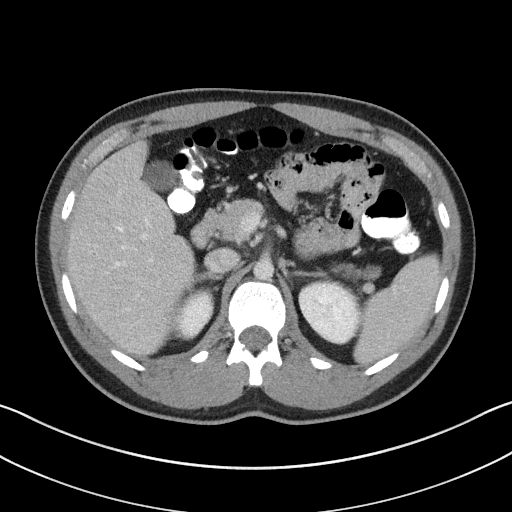
[im 81/93  soft-tissue]
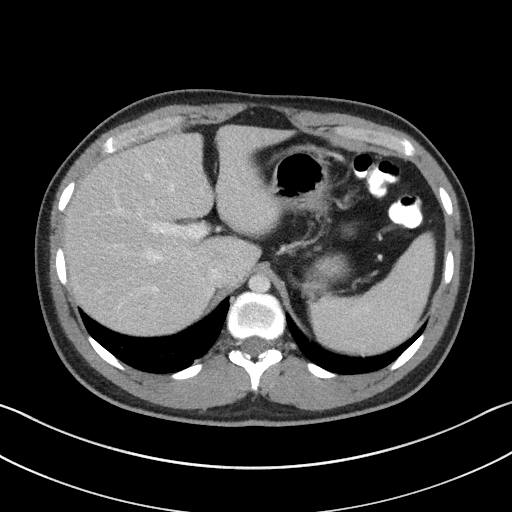
[im 89/93  soft-tissue]
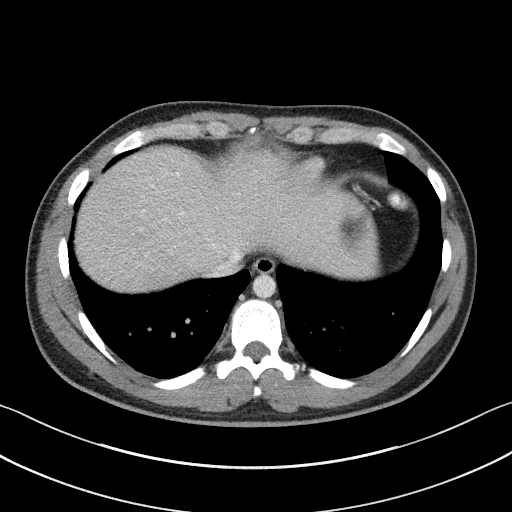

[Series 8: coronal st · coronal · 0.71mm/px · 3 of 77 slices shown]
[im 26/77  soft-tissue]
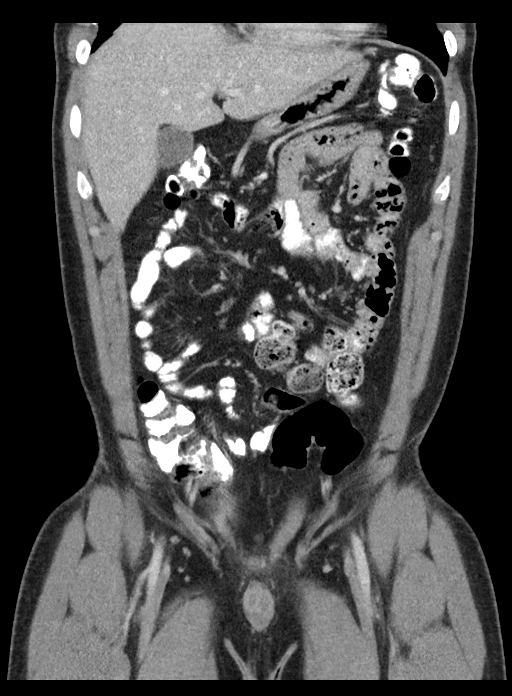
[im 34/77  soft-tissue]
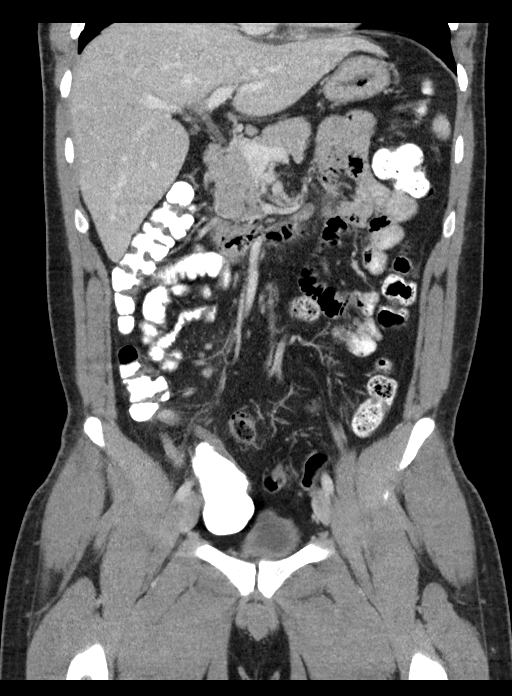
[im 43/77  soft-tissue]
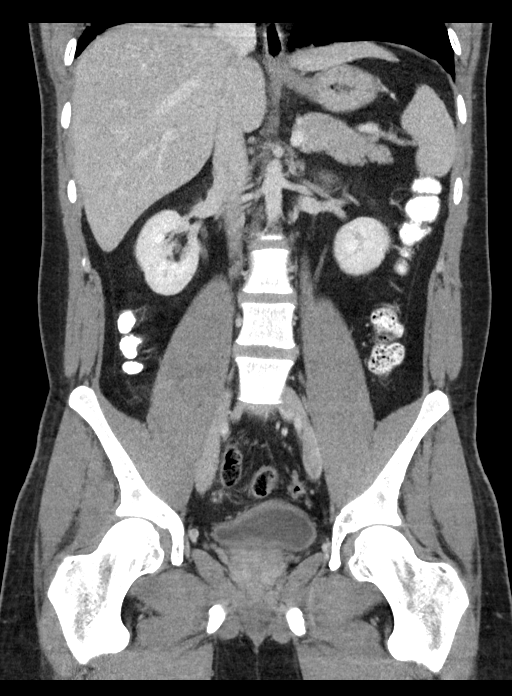

[16 of 46 positions shown; findings below may reference images not displayed]

FINDINGS: Lower chest: No acute abnormality.

Hepatobiliary: No focal liver abnormality is seen. No gallstones,
gallbladder wall thickening, or biliary dilatation.

Pancreas: Unremarkable. No pancreatic ductal dilatation or
surrounding inflammatory changes.

Spleen: Normal in size without focal abnormality.

Adrenals/Urinary Tract: Adrenal glands are unremarkable. Kidneys are
normal, without renal calculi, focal lesion, or hydronephrosis.
Bladder is unremarkable.

Stomach/Bowel: There is no evidence of bowel obstruction. The
appendix is visualized and there is noted contrast and a small
amount of gas in its distal portion. However, its midportion is
thickened at 11 mm with possible minimal inflammatory changes, and
these findings are equivocal for appendicitis.

Vascular/Lymphatic: No significant vascular findings are present. No
enlarged abdominal or pelvic lymph nodes.

Reproductive: Prostate is unremarkable.

Other: No abdominal wall hernia or abnormality. No abdominopelvic
ascites.

Musculoskeletal: No acute or significant osseous findings.
IMPRESSION: There is noted mild thickening of the midportion of the appendix
with possible surrounding inflammatory changes, and these findings
are equivocal for appendicitis. Correlation with white blood count
and physical exam is recommended to assess for the possibility of
appendicitis.

## 2019-01-27 ENCOUNTER — Other Ambulatory Visit: Payer: Self-pay

## 2019-01-27 DIAGNOSIS — Z20822 Contact with and (suspected) exposure to covid-19: Secondary | ICD-10-CM

## 2019-01-29 LAB — NOVEL CORONAVIRUS, NAA: SARS-CoV-2, NAA: NOT DETECTED

## 2019-12-17 ENCOUNTER — Other Ambulatory Visit: Payer: Self-pay

## 2019-12-17 ENCOUNTER — Ambulatory Visit: Payer: BC Managed Care – PPO | Admitting: Podiatry

## 2019-12-17 ENCOUNTER — Ambulatory Visit (INDEPENDENT_AMBULATORY_CARE_PROVIDER_SITE_OTHER): Payer: BC Managed Care – PPO

## 2019-12-17 ENCOUNTER — Encounter: Payer: Self-pay | Admitting: Podiatry

## 2019-12-17 DIAGNOSIS — M722 Plantar fascial fibromatosis: Secondary | ICD-10-CM

## 2019-12-17 DIAGNOSIS — M76822 Posterior tibial tendinitis, left leg: Secondary | ICD-10-CM | POA: Diagnosis not present

## 2019-12-17 DIAGNOSIS — S93602A Unspecified sprain of left foot, initial encounter: Secondary | ICD-10-CM

## 2019-12-17 MED ORDER — DICLOFENAC SODIUM 75 MG PO TBEC: 75.0000 mg | DELAYED_RELEASE_TABLET | Freq: Two times a day (BID) | ORAL | 1 refills | Status: AC

## 2019-12-17 NOTE — Progress Notes (Signed)
   HPI: 34 y.o. male presenting today for evaluation of a foot/ankle sprain that occurred approximately 2 weeks ago.  Patient states that he stepped out of his truck when he rolled his foot.  He now has pain on the inside of the left foot with some swelling.  He has not done anything for treatment other than ice and Tylenol.  Past Medical History:  Diagnosis Date  . Acute appendicitis 07/15/2016  . B12 deficiency 02/20/2016  . Vaccine counseling 02/19/2016  . Vitamin D insufficiency 02/20/2016     Physical Exam: General: The patient is alert and oriented x3 in no acute distress.  Dermatology: Skin is warm, dry and supple bilateral lower extremities. Negative for open lesions or macerations.  Vascular: Palpable pedal pulses bilaterally.  No erythema noted.  There is a moderate edema noted to the foot.  Capillary refill within normal limits.  Neurological: Epicritic and protective threshold grossly intact bilaterally.   Musculoskeletal Exam: Range of motion within normal limits to all pedal and ankle joints bilateral. Muscle strength 5/5 in all groups bilateral.  Tenderness to palpation along the navicular tuberosity of the left foot.  Radiographic Exam:  Normal osseous mineralization. Joint spaces preserved. No fracture/dislocation/boney destruction.  Accessory navicular noted on radiographic exam  Assessment: 1.  Foot sprain left 2.  Accessory navicular left   Plan of Care:  1. Patient evaluated. X-Rays reviewed.  2.  Patient declined cam boot and steroidal anti-inflammatory injection 3.  Ankle brace dispensed.  Wear daily x3 weeks 4.  Prescription for diclofenac 75 mg 2 times daily 5.  OTC power step insoles provided wear daily 6.  Return to clinic as needed  *Wife is a patient of mine.  She manages apartment complexes throughout the [place]      Felecia Shelling, DPM [facility]  Dr. Felecia Shelling, DPM    [address] [place], [place] [address]                Office [phone]  Fax [phone]

## 2019-12-20 ENCOUNTER — Other Ambulatory Visit: Payer: Self-pay | Admitting: Podiatry

## 2019-12-20 DIAGNOSIS — M76822 Posterior tibial tendinitis, left leg: Secondary | ICD-10-CM

## 2020-01-12 ENCOUNTER — Encounter: Payer: Self-pay | Admitting: Podiatry

## 2020-01-12 ENCOUNTER — Ambulatory Visit: Payer: BC Managed Care – PPO | Admitting: Podiatry

## 2020-01-12 ENCOUNTER — Other Ambulatory Visit: Payer: Self-pay

## 2020-01-12 DIAGNOSIS — M76829 Posterior tibial tendinitis, unspecified leg: Secondary | ICD-10-CM

## 2020-01-12 DIAGNOSIS — L603 Nail dystrophy: Secondary | ICD-10-CM | POA: Diagnosis not present

## 2020-01-12 MED ORDER — TERBINAFINE HCL 250 MG PO TABS: 250.0000 mg | ORAL_TABLET | Freq: Every day | ORAL | 0 refills | Status: AC

## 2020-01-12 NOTE — Progress Notes (Signed)
He presents today chief complaint of continued pain to the medial aspect of the navicular and posterior tibial tendon left foot.  He is also here for evaluation of the hallux nail left.  States that he noticed it get worse and come about and has started become more painful.  Objective: I reviewed his past medical history medications allergies surgery social history.  Hallux nail left does demonstrate discoloration thickening subungual debris and tenderness on palpation.  Pulses are strongly palpable neurologic sensorium is intact posterior tibial tendon is tender on palpation as its insertion site on the navicular is most tender.  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  Nail dystrophy hallux left.  Plan: Samples of the skin and nail were taken today for pathologic evaluation I went ahead and started him on Lamisil 250 mg tablets.  He will take 1 tablet daily.  I will follow-up with him in 30 days at which time he will see Raiford Noble for set of orthotics and we will also follow-up with the samples of nails that we took today.  She had questions or concerns he will notify us immediately.

## 2020-01-20 ENCOUNTER — Encounter: Payer: Self-pay | Admitting: *Deleted

## 2020-02-03 ENCOUNTER — Telehealth: Payer: Self-pay

## 2020-02-03 NOTE — Telephone Encounter (Signed)
-----   Message from Elinor Parkinson, [place] sent at 01/24/2020  6:55 AM EST ----- Positive for fungus. See him at next scheduled appointment.

## 2020-02-03 NOTE — Telephone Encounter (Signed)
Patient notified of results.

## 2020-02-16 ENCOUNTER — Other Ambulatory Visit: Payer: BC Managed Care – PPO | Admitting: Orthotics

## 2020-02-16 ENCOUNTER — Ambulatory Visit: Payer: BC Managed Care – PPO | Admitting: Podiatry

## 2020-02-23 ENCOUNTER — Ambulatory Visit: Payer: BC Managed Care – PPO | Admitting: Podiatry

## 2020-03-08 ENCOUNTER — Encounter: Payer: Self-pay | Admitting: Podiatry

## 2020-03-08 ENCOUNTER — Other Ambulatory Visit: Payer: Self-pay

## 2020-03-08 ENCOUNTER — Ambulatory Visit: Payer: BC Managed Care – PPO | Admitting: Podiatry

## 2020-03-08 DIAGNOSIS — Z79899 Other long term (current) drug therapy: Secondary | ICD-10-CM

## 2020-03-08 MED ORDER — TERBINAFINE HCL 250 MG PO TABS: 250.0000 mg | ORAL_TABLET | Freq: Every day | ORAL | 0 refills | Status: AC

## 2020-03-08 NOTE — Progress Notes (Signed)
He presents today for follow-up of his antifungal medication for his toenails into evaluate posterior tibial tendon.  He states the posterior tibial tendon is feeling much better and his states that his feet and his nails are starting to look better.  Objective: Vital signs are stable he is alert and oriented x3 there is no erythema edema cellulitis drainage or odor nails are starting to clear from proximal to distal and though he has not completed his first 30 days of medication as of yet since 10 November.  But he does state the posterior tibial tendon is doing much better.  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  Plan: I recommended that he continue his first 30 days of Lamisil.  Wrote a prescription for the blood work which he will have done after he is completed that first 30 days.  He will then start the next 90 days which have already called in.  Hesaw [facility]if[facility] Noble today to have orthotics made and I will follow-up with him in a few months.

## 2020-04-05 ENCOUNTER — Encounter: Payer: BC Managed Care – PPO | Admitting: Orthotics

## 2020-07-10 ENCOUNTER — Ambulatory Visit: Payer: BC Managed Care – PPO | Admitting: Podiatry

## 2021-07-13 ENCOUNTER — Encounter: Payer: Self-pay | Admitting: Podiatry
